# Patient Record
Sex: Male | Born: 2000 | Race: Black or African American | Hispanic: No | Marital: Single | Smoking: Never smoker
Health system: Southern US, Community
[De-identification: ages and names within clinical notes are randomized; demographics above are authoritative.]

---
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
## 2019-10-15 ENCOUNTER — Encounter (HOSPITAL_COMMUNITY): Payer: Self-pay

## 2019-10-15 ENCOUNTER — Other Ambulatory Visit: Payer: Self-pay

## 2019-10-15 ENCOUNTER — Ambulatory Visit (HOSPITAL_COMMUNITY)
Admission: EM | Admit: 2019-10-15 | Discharge: 2019-10-15 | Disposition: A | Discharge status: Home / Self Care | Payer: 59 | Attending: Physician Assistant | Admitting: Physician Assistant

## 2019-10-15 DIAGNOSIS — S6991XA Unspecified injury of right wrist, hand and finger(s), initial encounter: Secondary | ICD-10-CM | POA: Diagnosis not present

## 2019-10-15 DIAGNOSIS — Z23 Encounter for immunization: Secondary | ICD-10-CM | POA: Diagnosis not present

## 2019-10-15 MED ORDER — TETANUS-DIPHTH-ACELL PERTUSSIS 5-2.5-18.5 LF-MCG/0.5 IM SUSP
INTRAMUSCULAR | Status: AC
  Filled 2019-10-15: qty 0.5

## 2019-10-15 MED ORDER — TETANUS-DIPHTH-ACELL PERTUSSIS 5-2.5-18.5 LF-MCG/0.5 IM SUSP
0.5000 mL | Freq: Once | INTRAMUSCULAR | Status: AC
  Administered 2019-10-15: 0.5 mL via INTRAMUSCULAR

## 2019-10-15 NOTE — Discharge Instructions (Signed)
Keep the finger clean. If you notice worsening pain, lots of redness or drainage please return for reevaluation  Otherwise I believe if there is any retained glass, it will push out as your nail grows.

## 2019-10-15 NOTE — ED Provider Notes (Signed)
[facility]    CSN: [id number] Arrival date & time: 10/15/19  9470      History   Chief Complaint Chief Complaint  Patient presents with  . Glass in Finger    HPI Brent Trujillo is a 19 y.o. male.   Patient reports for evaluation of possible class under his right ring finger nail.  Reports on Sunday a piece of broken glass from his phone screen went under his fingernail.  Report immediate pain.  He reports and his mother were able to pull a piece of glass out but concerned that there might have been a broken piece retained.  He did a bit of pain throughout the day Sunday however has since had resolution of pain.  There has been no finger swelling, Bleeding or discharge.     History reviewed. No pertinent past medical history.  There are no problems to display for this patient.   History reviewed. No pertinent surgical history.     Home Medications    Prior to Admission medications   Not on File    Family History Family History  Family history unknown: Yes    Social History Social History   Tobacco Use  . Smoking status: Never Smoker  Substance Use Topics  . Alcohol use: Not on file  . Drug use: Not on file     Allergies   Patient has no known allergies.   Review of Systems Review of Systems  Skin:       See HPI     Physical Exam Triage Vital Signs ED Triage Vitals [10/15/19 0853]  Enc Vitals Group     BP (!) 142/76     Pulse Rate 76     Resp 18     Temp 98.7 F (37.1 C)     Temp Source Oral     SpO2 100 %     Weight      Height      Head Circumference      Peak Flow      Pain Score 4     Pain Loc      Pain Edu?      Excl. in GC?    No data found.  Updated Vital Signs BP (!) 142/76 (BP Location: Left Arm)   Pulse 76   Temp 98.7 F (37.1 C) (Oral)   Resp 18   SpO2 100%   Visual Acuity Right Eye Distance:   Left Eye Distance:   Bilateral Distance:    Right Eye Near:   Left Eye Near:    Bilateral Near:      Physical Exam Constitutional:      General: He is not in acute distress.    Appearance: Normal appearance.  Skin:    General: Skin is warm and dry.     Comments: 3 mm of distal discoloration of the ring finger nailbed.  There is no obvious glass appreciable.  Minimal to no tenderness with palpation of the finger.  No erythema or discharge.  Neurological:     Mental Status: He is alert.      UC Treatments / Results  Labs (all labs ordered are listed, but only abnormal results are displayed) Labs Reviewed - No data to display  EKG   Radiology No results found.  Procedures Procedures (including critical care time)  Medications Ordered in UC Medications  Tdap (BOOSTRIX) injection 0.5 mL (has no administration in time range)    Initial Impression / Assessment  and Plan / UC Course  I have reviewed the triage vital signs and the nursing notes.  Pertinent labs & imaging results that were available during my care of the patient were reviewed by me and considered in my medical decision making (see chart for details).     #Finger injury Patient a 19 year old presenting after having glass injured his nailbed.  Attempt was made to locate any remaining glass however none was appreciated given minimal to no pain with palpation and manipulation of the finger doubt there is any retained glass at this point.  Instructed patient that we will watch this and that if there is any retained glass this will grow out with the fingernail.  Return precautions for signs of infection or worsening pain were discussed.  Patient verbalized understanding.  Hygiene was discussed.  We will update his tetanus shot as there is no recent records within the last 5 years. Final Clinical Impressions(s) / UC Diagnoses   Final diagnoses:  Injury of finger of right hand, initial encounter     Discharge Instructions     Keep the finger clean. If you notice worsening pain, lots of redness or drainage please  return for reevaluation  Otherwise I believe if there is any retained glass, it will push out as your nail grows.     ED Prescriptions    None     PDMP not reviewed this encounter.   Purnell Shoemaker, PA-C 10/15/19 [phone]

## 2019-10-15 NOTE — ED Triage Notes (Signed)
Pt presents with glass in right middle finger from tempered glass from cell phone when it cracked 2 days ago.

## 2020-02-10 ENCOUNTER — Ambulatory Visit: Payer: 59 | Admitting: Registered Nurse

## 2020-02-11 ENCOUNTER — Encounter: Payer: Self-pay | Admitting: Registered Nurse
